# Patient Record
Sex: Male | Born: 1990 | Race: Black or African American | Hispanic: No | Marital: Married
Health system: Midwestern US, Community
[De-identification: ages and names within clinical notes are randomized; demographics above are authoritative.]

---

## 2011-09-20 IMAGING — CR DG HAND COMPLETE 3+V*R*
3 series · 3 of 3 positions shown · non-contrast
Comparison: Plain films 08/04/2007.

CLINICAL DATA: Pain.  History prior fracture.

RIGHT HAND - COMPLETE 3+ VIEW

[view not recorded (1 of 3)]
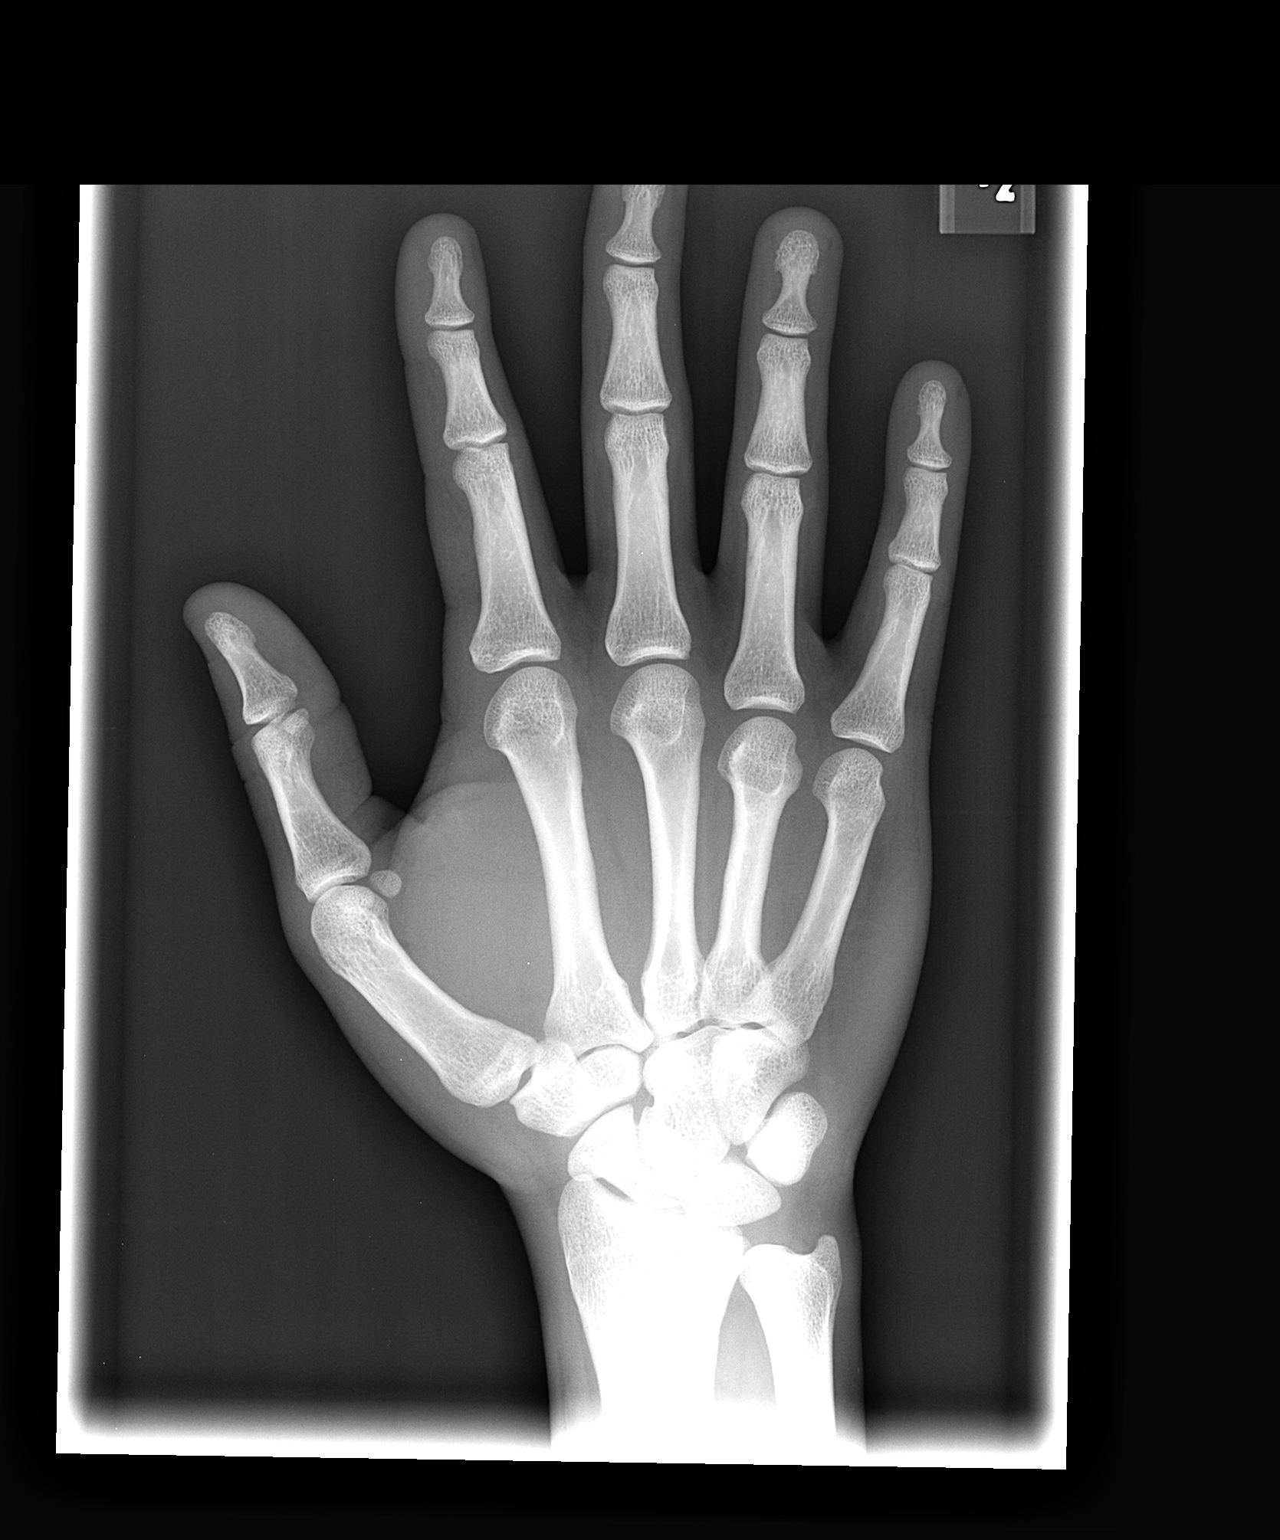

[view not recorded (2 of 3)]
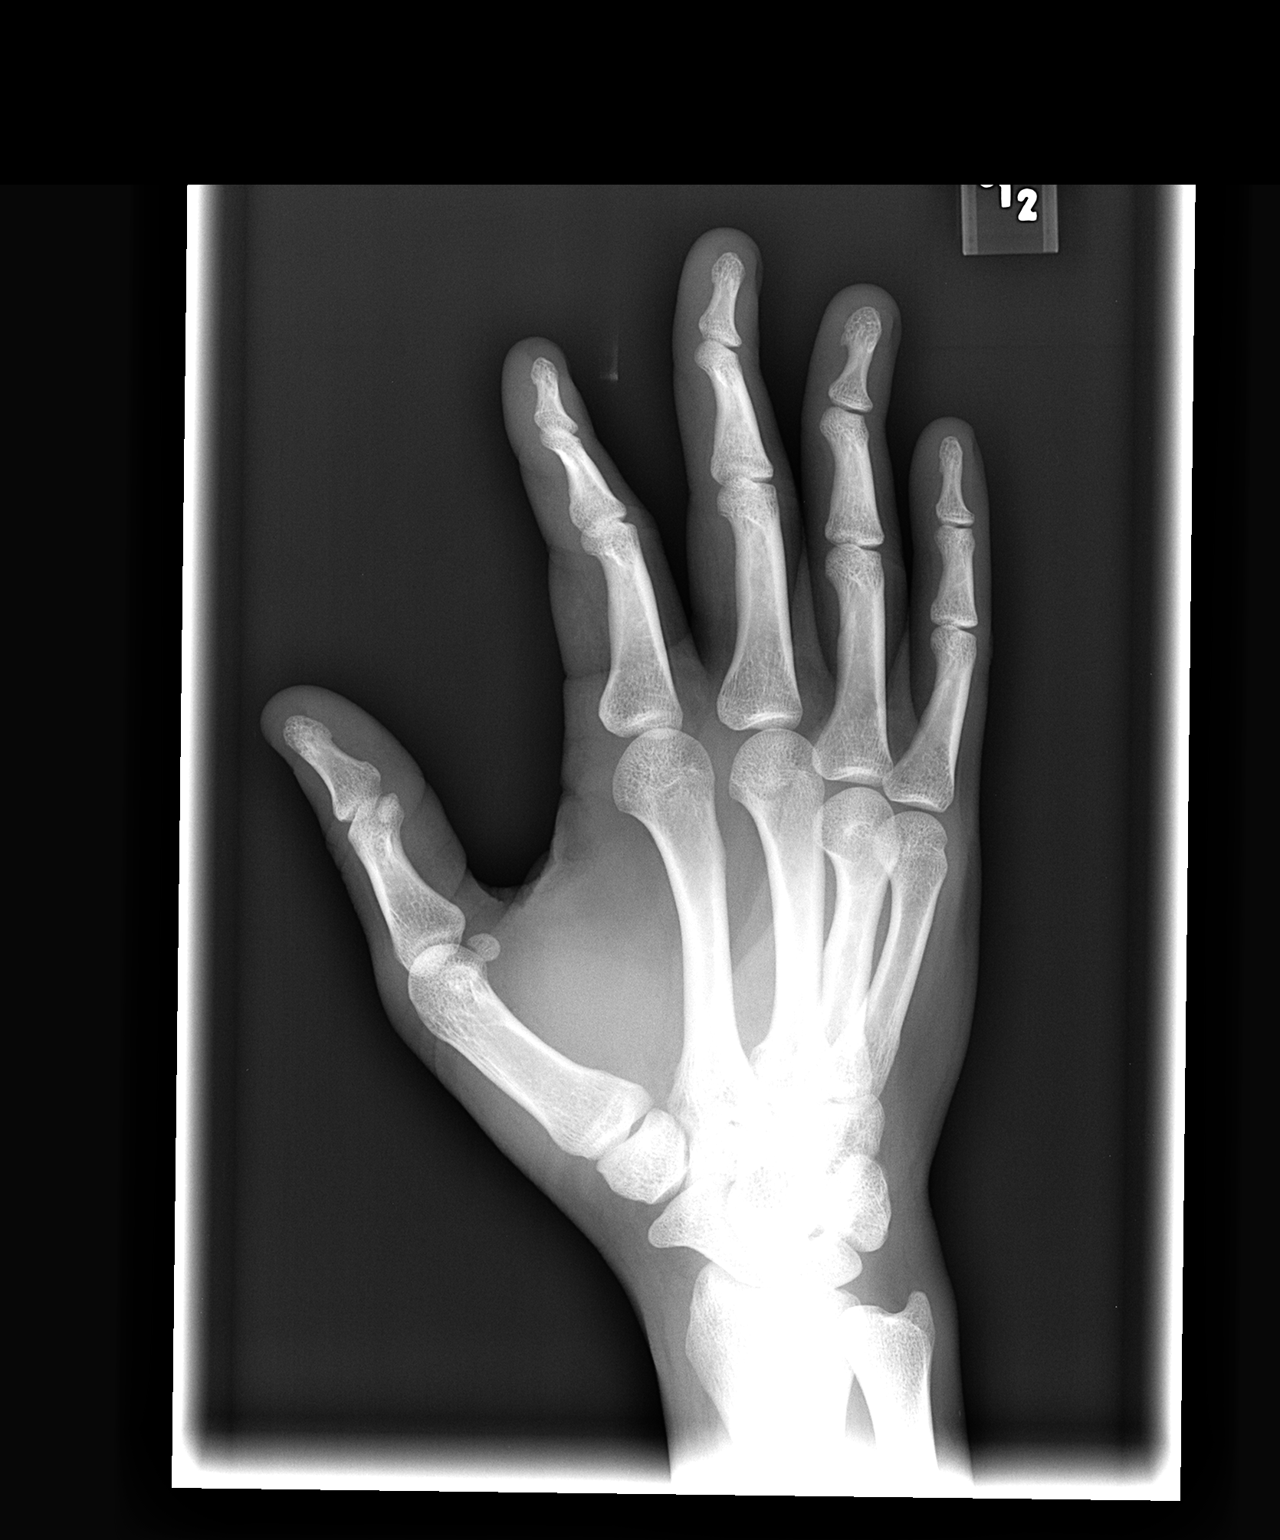

[view not recorded (3 of 3)]
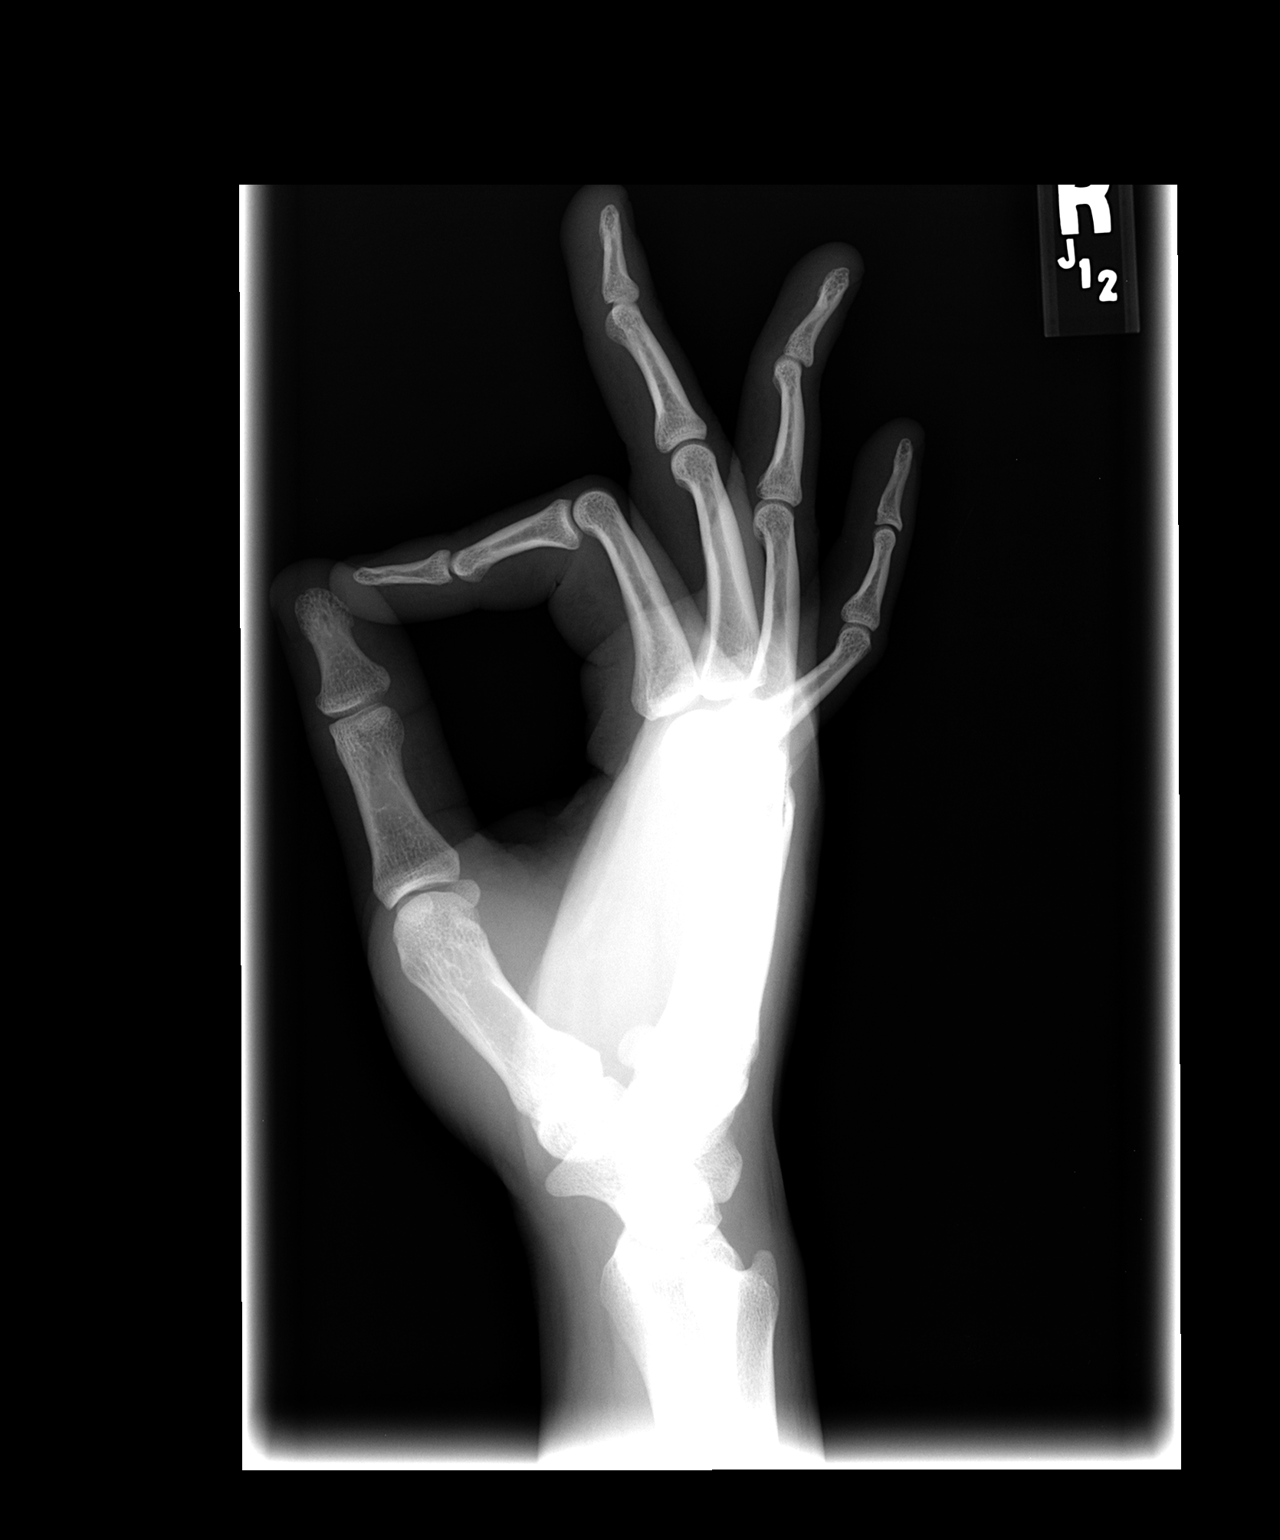

[3 of 3 positions shown; findings below may reference images not displayed]

FINDINGS: Plate and screws fixing an old fourth metacarpal fracture
have been removed.  No retained hardware.  Fracture is well healed
with anatomic position and alignment.  No acute bony or joint
abnormality is identified.
IMPRESSION: 1.  No acute finding.
2.  Interval removal of plate and screws fixing an old fourth
metacarpal without evidence of complication.

## 2014-07-26 LAB — POC CHEM8
BUN: 11 mg/dl (ref 7–25)
CALCIUM,IONIZED: 5 mg/dL (ref 4.40–5.40)
CO2, TOTAL: 25 mmol/L (ref 21–32)
Chloride: 102 mEq/L (ref 98–107)
Creatinine: 1.4 mg/dl — ABNORMAL HIGH (ref 0.6–1.3)
Glucose: 111 mg/dL — ABNORMAL HIGH (ref 74–106)
HCT: 45 % (ref 40–54)
HGB: 15.3 gm/dl (ref 13.0–17.2)
Potassium: 4 mEq/L (ref 3.5–4.9)
Sodium: 139 mEq/L (ref 136–145)

## 2014-07-26 LAB — CBC WITH AUTOMATED DIFF
BASOPHILS: 0.3 % (ref 0–3)
EOSINOPHILS: 0 % (ref 0–5)
HCT: 41.8 % (ref 37.0–50.0)
HGB: 14.7 gm/dl (ref 12.4–17.2)
IMMATURE GRANULOCYTES: 0.6 % (ref 0.0–3.0)
LYMPHOCYTES: 4.9 % — ABNORMAL LOW (ref 28–48)
MCH: 32.9 pg (ref 23.0–34.6)
MCHC: 35.2 gm/dl (ref 30.0–36.0)
MCV: 93.5 fL (ref 80.0–98.0)
MONOCYTES: 5.1 % (ref 1–13)
MPV: 10.7 fL — ABNORMAL HIGH (ref 6.0–10.0)
NEUTROPHILS: 89.1 % — ABNORMAL HIGH (ref 34–64)
NRBC: 0 (ref 0–0)
PLATELET: 237 10*3/uL (ref 140–450)
RBC: 4.47 M/uL (ref 3.80–5.70)
RDW-SD: 39.5 (ref 35.1–43.9)
WBC: 19.9 10*3/uL — ABNORMAL HIGH (ref 4.0–11.0)

## 2014-07-26 LAB — POC URINE MACROSCOPIC
Bilirubin: NEGATIVE
Blood: NEGATIVE
Glucose: NEGATIVE mg/dl
Ketone: NEGATIVE mg/dl
Leukocyte Esterase: NEGATIVE
Nitrites: NEGATIVE
Protein: NEGATIVE mg/dl
Specific gravity: 1.02 (ref 1.005–1.030)
Urobilinogen: 1 EU/dl (ref 0.0–1.0)
pH (UA): 8.5 (ref 5–9)

## 2014-07-26 NOTE — ED Provider Notes (Addendum)
[facility]  EMER[facility]REATMENT REPORT  NAME:  Francisco West, Francisco West  SEX:   M  ADMIT: 07/26/2014  DOB:   1991-05-02  MR#    [id number]  ROOM:    TIME DICTATED: 06 26 PM  ACCT#  [id number]    cc: [facility]     CHIEF COMPLAINT:  Right groin pain.    PRIMARY CARE PHYSICIAN:  [facility].    ALLERGIES:  Denies.    MEDICATIONS:  Denies.    HISTORY OF PRESENT ILLNESS:  This is a 23 year old black male who states that he is having some right  inguinal pain, said the pain started around 12 noon today.  He said that he  felt a little funny tickling sensation in his lower back about half an hour  prior to the pain starting, while he was urinating, but he denies any dysuria.  Denies any recent fever or chills, sweats but states that now he has pain that  developed while standing duty in the right inguinal area.  Pain is worse with  standing and walking.  It is better with lying back and having his legs  elevated.  States that the pain is a 2 out of 10 currently lying on the  gurney.  He also states that he is experiencing a sharp pain that seems to be  down the medial side of his right leg.  Denies any swelling in the leg.  Denies any recent trauma.    REVIEW OF SYSTEMS:  CONSTITUTIONAL:  Denies any fever, chills, weight loss.  ENT:  Denies any sore throat, runny nose or URI symptoms.  HEMATOLOGIC AND LYMPHATIC:  Denies any swollen lymph nodes.  He does give a  history of G6PD deficiency.  RESPIRATORY:  Denies any cough, shortness of breath, difficulty breathing.  CARDIOVASCULAR:  Denies any chest pain, chest pressure.  GASTROINTESTINAL:  Denies any nausea, vomiting, diarrhea or abdominal pain  except for the pain that is in the right inguinal area.  GENITOURINARY:  Denies any dysuria, frequency, urgency or penile discharge.  Denies any new sexual partners, is monogamous with his wife.  MUSCULOSKELETAL:  He does have the pain that radiates down the right leg.  No  joint pains.   INTEGUMENTARY:  Denies any rashes.  NEUROLOGIC:  Denies any headache.    PAST MEDICAL HISTORY:  G6PD deficiency.      SOCIAL HISTORY:  He does smoke 3 or 4 cigarettes a day.  Denies alcohol.  Denies any  recreational drug use.  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     PHYSICAL EXAMINATION:  VITAL SIGNS:  BP 131/83, respirations 16, O2 saturation 99% on room air, pulse  is 100, temperature is 101.1, pain 2 on a scale of 1 to 10.  GENERAL APPEARANCE:  Well appearing black male in no acute distress.  HEENT:  Normocephalic, atraumatic.  Ears:  Canals patent.  TMs pearly.  Nares  clear.  Pupils equal, round, reactive to light and accommodation.  NECK:  Supple, nontender.  LUNGS:  Clear to auscultation.  HEART:  Regular rate and rhythm, no murmurs, rubs or gallops.  ABDOMEN:  Positive bowel sounds, soft.  No notable tenderness, may be a little  bit of right lower quadrant tenderness that is very mild and seems more in the  right inguinal area.   MUSCULOSKELETAL:  He does have some pain that is very difficult to reproduce  in the right leg down to the medial calf.  No swelling in the leg.  No  erythema, no increased warmth or induration and he is notably tender in the  right anterior inguinal area.  SKIN:  No rashes.    INITIAL ASSESSMENT AND MANAGEMENT PLAN:  Fever, right inguinal pain, etiology unclear at this time.  Case was discussed  with Dr. Hervey Ardsuchitani.  Will get labs and a CT of abdomen and pelvis to extend  down below the right inguinal area.  Further dictation to follow.    CONTINUATION BY Tana ConchSARA N. TSUCHITANI, MD:     PVL of the leg negative study for DVT of the right leg, inguinal adenopathy  noted bilaterally, right greater than left.  CT abdomen and pelvis read by  radiology, Dr. Allena KatzPatel, showed no evidence of hydronephrosis or nephrolithiasis  to suggest hepatic steatosis.  No inflammatory changes in the right lower  quadrant to suggest acute appendicitis.  CBC:  White count 19.9, hemoglobin   and platelet is normal.  I-STAT 8:  Glucose 111, creatinine 1.4.  Urine dip  negative.      ER COURSE:  The patient is here with a complaint of pain in the right groin really in the  inguinal crease and the skin exam and physical exam is unremarkable except for  pain to palpation.  There is no appreciable swelling, no palpable lymph nodes,  the scrotum and testicles are nontender.  The patient denies dysuria, IV drug  use.  He denies discharge.  There is no redness or signs of cellulitis.  We  inspected the GU area and inspected the buttocks.  The patient is still  febrile, have not medicated due to the fact that he has G6PD.  Checked with  pharmacist, morphine is okay to give per them and so this was ordered.  We  also in addition to CT of abdomen and pelvis to include the proximal thigh, to  rule out appendicitis causing referred pain or psoas abscess or any type of  other intraabdominal abscess.  This was unremarkable.  The patient I feel  needs to be observed, has elevated white count, SIRS criteria, no appreciable  source that came in right after presentation so something may be developing.  There is no sign of a septic hip because the hip has good range of motion.  The patient is discussed with Dr[facility] Raford PitcherBarrett, [facility],  who has accepted the patient on behalf of the attending physician, Dr. Marolyn Hallerutler  and was [facility].  I  have not administered any antibiotics because I am not quite sure what the  source is at this time, being as we have not found any sign of an abscess.  This still could be viral but the patient is denying any other rhinorrhea,  aches or symptoms to suggest that this would be influenza.      The [facility] will be [facility] by [facility].    DIAGNOSES:  1.  Acute febrile illness.  2.  Groin pain.  3.  Leukocytosis.      ___________________  Judeen HammansSara N Tsuchitani MD  Dictated By: Lelan PonsMarc McAninley, PA-C     My signature above authenticates this document and my orders, the final  diagnosis (es), discharge prescription (s), and instructions in the [facility] record.  Nursing notes have been reviewed by the physician/mid-level provider.    If you have any questions please contact [phone].    AK  D:07/26/2014 18:26:16  T:  07/26/2014 20:10:14  [id number]  Electronically Authenticated and Edited by:  Tana Conch, MD On 08/07/2014 07:44 PM EST
